# Patient Record
Sex: Male | Born: 1985 | Race: White | Hispanic: No | Marital: Single | Smoking: Never smoker
Health system: Southern US, Community
[De-identification: ages and names within clinical notes are randomized; demographics above are authoritative.]

## PROBLEM LIST (undated history)

## (undated) DIAGNOSIS — F840 Autistic disorder: Secondary | ICD-10-CM

## (undated) HISTORY — PX: NO PAST SURGERIES: SHX2092

---

## 2009-04-09 ENCOUNTER — Emergency Department (HOSPITAL_COMMUNITY): Admission: EM | Admit: 2009-04-09 | Discharge: 2009-04-09 | Payer: Self-pay | Admitting: Emergency Medicine

## 2010-04-30 LAB — URINALYSIS, ROUTINE W REFLEX MICROSCOPIC
Bilirubin Urine: NEGATIVE
Glucose, UA: NEGATIVE mg/dL
Nitrite: NEGATIVE
Specific Gravity, Urine: 1.015 (ref 1.005–1.030)
pH: 7.5 (ref 5.0–8.0)

## 2011-04-11 ENCOUNTER — Emergency Department (HOSPITAL_COMMUNITY): Payer: Medicaid Other

## 2011-04-11 ENCOUNTER — Encounter (HOSPITAL_COMMUNITY): Payer: Self-pay

## 2011-04-11 ENCOUNTER — Emergency Department (HOSPITAL_COMMUNITY)
Admission: EM | Admit: 2011-04-11 | Discharge: 2011-04-11 | Disposition: A | Discharge status: Home / Self Care | Payer: Medicaid Other | Attending: Emergency Medicine | Admitting: Emergency Medicine

## 2011-04-11 DIAGNOSIS — R109 Unspecified abdominal pain: Secondary | ICD-10-CM | POA: Insufficient documentation

## 2011-04-11 DIAGNOSIS — N200 Calculus of kidney: Secondary | ICD-10-CM

## 2011-04-11 DIAGNOSIS — N201 Calculus of ureter: Secondary | ICD-10-CM | POA: Insufficient documentation

## 2011-04-11 DIAGNOSIS — Z79899 Other long term (current) drug therapy: Secondary | ICD-10-CM | POA: Insufficient documentation

## 2011-04-11 DIAGNOSIS — F840 Autistic disorder: Secondary | ICD-10-CM | POA: Insufficient documentation

## 2011-04-11 HISTORY — DX: Autistic disorder: F84.0

## 2011-04-11 MED ORDER — HYDROCODONE-ACETAMINOPHEN 5-500 MG PO TABS: 1.0000 | ORAL_TABLET | Freq: Four times a day (QID) | ORAL | Status: AC | PRN

## 2011-04-11 MED ORDER — KETOROLAC TROMETHAMINE 30 MG/ML IJ SOLN
30.0000 mg | Freq: Once | INTRAMUSCULAR | Status: AC
  Administered 2011-04-11: 30 mg via INTRAVENOUS
  Filled 2011-04-11: qty 1

## 2011-04-11 NOTE — Discharge Instructions (Signed)

## 2011-04-11 NOTE — ED Provider Notes (Signed)
History     CSN: [id number]  Arrival date & time 04/11/11  0446   First MD Initiated Contact with Patient 04/11/11 0510      Chief Complaint  Patient presents with  . Flank Pain    (Consider location/radiation/quality/duration/timing/severity/associated sxs/prior treatment) HPI Comments: Had kidney stone 2 years ago on the other side, passed on own.  This feels the same.  Patient is a 26 y.o. male presenting with flank pain. The history is provided by the patient.  Flank Pain This is a new problem. The current episode started 1 to 2 hours ago. The problem occurs constantly. The problem has been rapidly worsening. Associated symptoms include abdominal pain. The symptoms are aggravated by nothing. The symptoms are relieved by nothing. He has tried nothing for the symptoms.    Past Medical History  Diagnosis Date  . Autism     History reviewed. No pertinent past surgical history.  No family history on file.  History  Substance Use Topics  . Smoking status: Never Smoker   . Smokeless tobacco: Not on file  . Alcohol Use: No      Review of Systems  Gastrointestinal: Positive for abdominal pain.  Genitourinary: Positive for flank pain.  All other systems reviewed and are negative.    Allergies  Review of patient's allergies indicates no known allergies.  Home Medications   Current Outpatient Rx  Name Route Sig Dispense Refill  . ALPRAZOLAM 0.5 MG PO TABS Oral Take 0.5 mg by mouth at bedtime as needed.    . ARIPIPRAZOLE 5 MG PO TABS Oral Take 5 mg by mouth daily.    Marland Kitchen DIVALPROEX SODIUM 125 MG PO CPSP Oral Take 125 mg by mouth 2 (two) times daily.      BP 139/61  Pulse 75  Temp(Src) 98.1 F (36.7 C) (Oral)  Resp 18  Ht 6' (1.829 m)  Wt 307 lb (139.254 kg)  BMI 41.64 kg/m2  SpO2 100%  Physical Exam  Nursing note and vitals reviewed. Constitutional: He is oriented to person, place, and time. He appears well-developed and well-nourished. No distress.  HENT:    Head: Normocephalic and atraumatic.  Neck: Normal range of motion. Neck supple.  Cardiovascular: Regular rhythm.   No murmur heard. Pulmonary/Chest: Effort normal and breath sounds normal. No respiratory distress.  Abdominal: Soft. Bowel sounds are normal. He exhibits no distension. There is no tenderness.  Musculoskeletal: Normal range of motion. He exhibits no edema.  Neurological: He is alert and oriented to person, place, and time.  Skin: Skin is warm and dry. He is not diaphoretic.    ED Course  Procedures (including critical care time)   Labs Reviewed  URINALYSIS, ROUTINE W REFLEX MICROSCOPIC   No results found.   No diagnosis found.    MDM  The CT shows a 2 mm stone in the distal uvj.  He is feeling better with the toradol.  Will discharge to home with lortab, plenty of fluids, follow up prn if febrile or not improving in the next few days.        Geoffery Lyons, MD 04/11/11 [phone]

## 2011-04-11 NOTE — ED Notes (Signed)
Right flank pain onset approx 1 am with nausea, no vomiting, states pain is sharp.  Previous hx of kidney stone on left.

## 2012-01-22 ENCOUNTER — Encounter (HOSPITAL_COMMUNITY): Payer: Self-pay | Admitting: Emergency Medicine

## 2012-01-22 ENCOUNTER — Emergency Department (HOSPITAL_COMMUNITY)
Admission: EM | Admit: 2012-01-22 | Discharge: 2012-01-22 | Disposition: A | Discharge status: Home / Self Care | Payer: Medicaid Other | Attending: Emergency Medicine | Admitting: Emergency Medicine

## 2012-01-22 DIAGNOSIS — IMO0001 Reserved for inherently not codable concepts without codable children: Secondary | ICD-10-CM | POA: Insufficient documentation

## 2012-01-22 DIAGNOSIS — R51 Headache: Secondary | ICD-10-CM | POA: Insufficient documentation

## 2012-01-22 DIAGNOSIS — Z79899 Other long term (current) drug therapy: Secondary | ICD-10-CM | POA: Insufficient documentation

## 2012-01-22 DIAGNOSIS — J3489 Other specified disorders of nose and nasal sinuses: Secondary | ICD-10-CM | POA: Insufficient documentation

## 2012-01-22 DIAGNOSIS — R059 Cough, unspecified: Secondary | ICD-10-CM | POA: Insufficient documentation

## 2012-01-22 DIAGNOSIS — R5381 Other malaise: Secondary | ICD-10-CM | POA: Insufficient documentation

## 2012-01-22 DIAGNOSIS — H9209 Otalgia, unspecified ear: Secondary | ICD-10-CM | POA: Insufficient documentation

## 2012-01-22 DIAGNOSIS — K1370 Unspecified lesions of oral mucosa: Secondary | ICD-10-CM | POA: Insufficient documentation

## 2012-01-22 DIAGNOSIS — Z209 Contact with and (suspected) exposure to unspecified communicable disease: Secondary | ICD-10-CM | POA: Insufficient documentation

## 2012-01-22 DIAGNOSIS — L539 Erythematous condition, unspecified: Secondary | ICD-10-CM | POA: Insufficient documentation

## 2012-01-22 DIAGNOSIS — R05 Cough: Secondary | ICD-10-CM | POA: Insufficient documentation

## 2012-01-22 DIAGNOSIS — R5383 Other fatigue: Secondary | ICD-10-CM | POA: Insufficient documentation

## 2012-01-22 DIAGNOSIS — B349 Viral infection, unspecified: Secondary | ICD-10-CM

## 2012-01-22 DIAGNOSIS — B9789 Other viral agents as the cause of diseases classified elsewhere: Secondary | ICD-10-CM | POA: Insufficient documentation

## 2012-01-22 DIAGNOSIS — R000 Tachycardia, unspecified: Secondary | ICD-10-CM | POA: Insufficient documentation

## 2012-01-22 DIAGNOSIS — F840 Autistic disorder: Secondary | ICD-10-CM | POA: Insufficient documentation

## 2012-01-22 DIAGNOSIS — J029 Acute pharyngitis, unspecified: Secondary | ICD-10-CM | POA: Insufficient documentation

## 2012-01-22 MED ORDER — GUAIFENESIN-CODEINE 100-10 MG/5ML PO SOLN
10.0000 mL | Freq: Once | ORAL | Status: AC
  Administered 2012-01-22: 10 mL via ORAL
  Filled 2012-01-22: qty 10

## 2012-01-22 MED ORDER — GUAIFENESIN-CODEINE 100-10 MG/5ML PO SYRP: 10.0000 mL | ORAL_SOLUTION | Freq: Three times a day (TID) | ORAL | Status: AC | PRN

## 2012-01-22 MED ORDER — OSELTAMIVIR PHOSPHATE 75 MG PO CAPS: 75.0000 mg | ORAL_CAPSULE | Freq: Two times a day (BID) | ORAL | Status: DC

## 2012-01-22 MED ORDER — SODIUM CHLORIDE 0.9 % IV SOLN
Freq: Once | INTRAVENOUS | Status: AC
  Administered 2012-01-22: 11:00:00 via INTRAVENOUS

## 2012-01-22 MED ORDER — IBUPROFEN 800 MG PO TABS
800.0000 mg | ORAL_TABLET | Freq: Once | ORAL | Status: AC
  Administered 2012-01-22: 800 mg via ORAL
  Filled 2012-01-22: qty 1

## 2012-01-22 MED ORDER — IBUPROFEN 800 MG PO TABS: 800.0000 mg | ORAL_TABLET | Freq: Three times a day (TID) | ORAL | Status: DC

## 2012-01-22 NOTE — ED Notes (Signed)
Pt c/o fever and cough x2 days 

## 2012-01-22 NOTE — ED Provider Notes (Signed)
History     CSN: [id number]  Arrival date & time 01/22/12  0909   First MD Initiated Contact with Patient 01/22/12 0913      Chief Complaint  Patient presents with  . Fever  . Cough    (Consider location/radiation/quality/duration/timing/severity/associated sxs/prior treatment) HPI Comments: Patient c/o fever, nasal congestion, intermittently productive cough, body aches and frontal headache.  Symptoms began 2 days ago.  States the cough is worse at night.  He denies vomiting, abdominal pain, dysuria, chest pain or shortness of breath  Patient is a 26 y.o. male presenting with URI. The history is provided by the patient and a parent.  URI The primary symptoms include fever, fatigue, headaches, ear pain, sore throat and cough. Primary symptoms do not include swollen glands, wheezing, abdominal pain, nausea, vomiting, myalgias, arthralgias or rash. The current episode started 2 days ago. This is a new problem. The problem has not changed since onset. The headache is not associated with neck stiffness or weakness.  The sore throat is not accompanied by trouble swallowing.  The onset of the illness is associated with exposure to sick contacts. Symptoms associated with the illness include chills and congestion. The illness is not associated with facial pain, sinus pressure or rhinorrhea.    Past Medical History  Diagnosis Date  . Autism     History reviewed. No pertinent past surgical history.  History reviewed. No pertinent family history.  History  Substance Use Topics  . Smoking status: Never Smoker   . Smokeless tobacco: Not on file  . Alcohol Use: No      Review of Systems  Constitutional: Positive for fever, chills and fatigue. Negative for activity change and appetite change.  HENT: Positive for ear pain, congestion and sore throat. Negative for rhinorrhea, trouble swallowing, neck pain, neck stiffness and sinus pressure.   Respiratory: Positive for cough. Negative  for chest tightness, shortness of breath and wheezing.   Cardiovascular: Negative for chest pain.  Gastrointestinal: Negative for nausea, vomiting and abdominal pain.  Genitourinary: Negative for dysuria and flank pain.  Musculoskeletal: Negative for myalgias and arthralgias.  Skin: Negative for rash.  Neurological: Positive for headaches. Negative for dizziness, weakness and numbness.  Hematological: Negative for adenopathy.  Psychiatric/Behavioral: Negative for confusion.  All other systems reviewed and are negative.    Allergies  Review of patient's allergies indicates no known allergies.  Home Medications   Current Outpatient Rx  Name  Route  Sig  Dispense  Refill  . ALPRAZOLAM 0.5 MG PO TABS   Oral   Take 0.5 mg by mouth at bedtime as needed.         . ARIPIPRAZOLE 5 MG PO TABS   Oral   Take 5 mg by mouth daily.         Marland Kitchen DIVALPROEX SODIUM 125 MG PO CPSP   Oral   Take 125 mg by mouth 2 (two) times daily.           There were no vitals taken for this visit.  Physical Exam  Nursing note and vitals reviewed. Constitutional: He is oriented to person, place, and time. He appears well-developed and well-nourished. No distress.  HENT:  Head: Normocephalic and atraumatic. No trismus in the jaw.  Right Ear: Tympanic membrane and ear canal normal.  Left Ear: Tympanic membrane and ear canal normal.  Nose: Mucosal edema and rhinorrhea present.  Mouth/Throat: Uvula is midline. Mucous membranes are dry. No uvula swelling. Posterior oropharyngeal erythema present. No oropharyngeal  exudate, posterior oropharyngeal edema or tonsillar abscesses.  Eyes: EOM are normal. Pupils are equal, round, and reactive to light.  Neck: Normal range of motion and phonation normal. Neck supple. No Brudzinski's sign and no Kernig's sign noted.  Cardiovascular: Regular rhythm, normal heart sounds and intact distal pulses.  Tachycardia present.   No murmur heard. Pulmonary/Chest: Effort  normal. No respiratory distress. He has no wheezes. He has no rales. He exhibits no tenderness.       Slightly coarse lung sounds bilaterally with active cough.  No rales or wheezing  Abdominal: Soft. He exhibits no distension and no mass. There is no tenderness. There is no rebound and no guarding.  Musculoskeletal: He exhibits no edema and no tenderness.  Lymphadenopathy:    He has no cervical adenopathy.  Neurological: He is alert and oriented to person, place, and time. He exhibits normal muscle tone. Coordination normal.  Skin: Skin is warm and dry.    ED Course  Procedures (including critical care time)  Labs Reviewed - No data to display No results found.      MDM     Patient is alert, non-toxic appearing, no meningeal signs .  Lungs are CTA bilaterally.  Pt has been taking OTC cold medication, vitals indicate tachycardia and pt appears slighlty dry so will rehydrate with IVF's.  Sx's are likely related to viral illness.  Will treat with Tamiflu, ibuprofen, and Robitussin AC.  Doubt PE.   On recheck, pt is feeling much better, vitals improved.  Patient also seen by EDP and care plan discussed.  Agrees to return here if sx's worsen.   Jayd Forrey L. [place], [place] 01/24/12 1417

## 2012-01-24 NOTE — ED Provider Notes (Signed)
Medical screening examination/treatment/procedure(s) were conducted as a shared visit with non-physician practitioner(s) and myself.  I personally evaluated the patient during the encounter.  Patient is hemodynamically stable. Stable after IV fluids   Donnetta Hutching, MD 01/24/12 1553

## 2012-07-28 ENCOUNTER — Emergency Department (HOSPITAL_COMMUNITY)
Admission: EM | Admit: 2012-07-28 | Discharge: 2012-07-28 | Disposition: A | Discharge status: Home / Self Care | Payer: Medicaid Other | Attending: Emergency Medicine | Admitting: Emergency Medicine

## 2012-07-28 ENCOUNTER — Encounter (HOSPITAL_COMMUNITY): Payer: Self-pay | Admitting: *Deleted

## 2012-07-28 DIAGNOSIS — K098 Other cysts of oral region, not elsewhere classified: Secondary | ICD-10-CM

## 2012-07-28 DIAGNOSIS — Z79899 Other long term (current) drug therapy: Secondary | ICD-10-CM | POA: Insufficient documentation

## 2012-07-28 DIAGNOSIS — K099 Cyst of oral region, unspecified: Secondary | ICD-10-CM | POA: Insufficient documentation

## 2012-07-28 DIAGNOSIS — F840 Autistic disorder: Secondary | ICD-10-CM | POA: Insufficient documentation

## 2012-07-28 NOTE — ED Provider Notes (Signed)
History    CSN: [id number] Arrival date & time 07/28/12  2020  First MD Initiated Contact with Patient 07/28/12 2025     Chief Complaint  Patient presents with  . Dental Pain   (Consider location/radiation/quality/duration/timing/severity/associated sxs/prior Treatment) Patient is a 27 y.o. male presenting with tooth pain. The history is provided by the patient.  Dental Pain Location:  Lower Lower teeth location:  30/RL 1st molar Quality:  No pain Severity:  No pain Chronicity:  New Worsened by:  Nothing tried Ineffective treatments:  None tried Associated symptoms: no difficulty swallowing, no facial pain, no facial swelling, no fever, no headaches, no neck pain, no neck swelling, no oral bleeding, no oral lesions and no trismus    Douglas Randolph is a 27 y.o. male who presents to the ED with a knot on the lower right gum area. He noticed the area today, no sure how long it has been there. The area is firm and there is no pain. He has not dental problems.  Past Medical History  Diagnosis Date  . Autism    History reviewed. No pertinent past surgical history. No family history on file. History  Substance Use Topics  . Smoking status: Never Smoker   . Smokeless tobacco: Not on file  . Alcohol Use: No    Review of Systems  Constitutional: Negative for fever and chills.  HENT: Negative for facial swelling, mouth sores and neck pain.        Know lower right jaw   Respiratory: Negative for cough.   Gastrointestinal: Negative for nausea and vomiting.  Skin: Negative for rash.  Neurological: Negative for headaches.  Psychiatric/Behavioral: The patient is not nervous/anxious.     Allergies  Review of patient's allergies indicates no known allergies.  Home Medications   Current Outpatient Rx  Name  Route  Sig  Dispense  Refill  . ALPRAZolam (XANAX) 0.5 MG tablet   Oral   Take 0.5 mg by mouth 2 (two) times daily.          . ARIPiprazole (ABILIFY) 5 MG tablet  Oral   Take 5 mg by mouth at bedtime.          . divalproex (DEPAKOTE ER) 500 MG 24 hr tablet   Oral   Take 500 mg by mouth at bedtime. Autism/ keeps his moods leveled out         . ibuprofen (ADVIL,MOTRIN) 800 MG tablet   Oral   Take 1 tablet (800 mg total) by mouth 3 (three) times daily.   21 tablet   0   . oseltamivir (TAMIFLU) 75 MG capsule   Oral   Take 1 capsule (75 mg total) by mouth 2 (two) times daily. For 5 days   10 capsule   0    There were no vitals taken for this visit. Physical Exam  Nursing note and vitals reviewed. Constitutional: He is oriented to person, place, and time. He appears well-developed and well-nourished. No distress.  HENT:  Head: Normocephalic.  Right Ear: Tympanic membrane normal.  Left Ear: Tympanic membrane normal.  Nose: Nose normal.  Mouth/Throat: Uvula is midline, oropharynx is clear and moist and mucous membranes are normal.  There is a cystic area palpated in the right lower gum area at the first molar. Non tender with palpation. The area if firm and stationary.  Eyes: EOM are normal.  Neck: Neck supple.  Pulmonary/Chest: Effort normal.  Musculoskeletal: Normal range of motion.  Neurological: He is alert  and oriented to person, place, and time. No cranial nerve deficit.  Skin: Skin is warm and dry.  Psychiatric: He has a normal mood and affect. His behavior is normal.    ED Course  Procedures  MDM  27 y.o. male with oral cystic lesion. No acute distress, no pain. Will have patient follow up with the dental clinic for further evaluation.  Discussed with the patient and his mother clinical findings and need for follow up. All questioned fully answered. He will return if any problems arise.   [employer], [place] 07/28/12 2059

## 2012-07-28 NOTE — ED Notes (Signed)
Knot in inner right jaw, denies any pain

## 2012-07-29 NOTE — ED Provider Notes (Signed)
Medical screening examination/treatment/procedure(s) were performed by non-physician practitioner and as supervising physician I was immediately available for consultation/collaboration. Jas Betten, MD, FACEP   Vineta Carone L Korey Arroyo, MD 07/29/12 0023 

## 2012-10-26 ENCOUNTER — Encounter (HOSPITAL_COMMUNITY): Payer: Self-pay | Admitting: Pharmacy Technician

## 2012-10-27 NOTE — Pre-Procedure Instructions (Signed)
Douglas Randolph  10/27/2012   Your procedure is scheduled on:  September 30  Report to [facility] Entrance "A" [address] at 06:00 AM.  Call this number if you have problems the morning of surgery: [phone]   Remember:   Do not eat food or drink liquids after midnight.   Take these medicines the morning of surgery with A SIP OF WATER: None   Do not take Aspirin, Aleve, Naproxen, Advil, Ibuprofen, Vitamin, Herbs, or Supplements starting today   Do not wear jewelry, make-up or nail polish.  Do not wear lotions, powders, or perfumes. You may wear deodorant.  Do not shave 48 hours prior to surgery. Men may shave face and neck.  Do not bring valuables to the hospital.  [facility] is not responsible                   for any belongings or valuables.  Contacts, dentures or bridgework may not be worn into surgery.  Leave suitcase in the car. After surgery it may be brought to your room.  For patients admitted to the hospital, checkout time is 11:00 AM the day of  discharge.   Patients discharged the day of surgery will not be allowed to drive  home.  Name and phone number of your driver: Family/ Friend  Special Instructions: Shower using CHG 2 nights before surgery and the night before surgery.  If you shower the day of surgery use CHG.  Use special wash - you have one bottle of CHG for all showers.  You should use approximately 1/3 of the bottle for each shower.   Please read over the following fact sheets that you were given: Pain Booklet, Coughing and Deep Breathing and Surgical Site Infection Prevention

## 2012-10-28 ENCOUNTER — Encounter (HOSPITAL_COMMUNITY): Payer: Self-pay

## 2012-10-28 ENCOUNTER — Encounter (HOSPITAL_COMMUNITY)
Admission: RE | Admit: 2012-10-28 | Discharge: 2012-10-28 | Disposition: A | Discharge status: Home / Self Care | Payer: Medicaid Other | Source: Ambulatory Visit | Attending: Oral Surgery | Admitting: Oral Surgery

## 2012-10-28 DIAGNOSIS — Z01818 Encounter for other preprocedural examination: Secondary | ICD-10-CM | POA: Insufficient documentation

## 2012-10-28 DIAGNOSIS — Z01812 Encounter for preprocedural laboratory examination: Secondary | ICD-10-CM | POA: Insufficient documentation

## 2012-10-28 LAB — CBC
MCH: 28.8 pg (ref 26.0–34.0)
MCV: 85.9 fL (ref 78.0–100.0)
Platelets: 270 10*3/uL (ref 150–400)
RBC: 5.59 MIL/uL (ref 4.22–5.81)
RDW: 12.5 % (ref 11.5–15.5)
WBC: 8.3 10*3/uL (ref 4.0–10.5)

## 2012-10-28 LAB — BASIC METABOLIC PANEL
CO2: 26 mEq/L (ref 19–32)
Calcium: 9.4 mg/dL (ref 8.4–10.5)
Chloride: 102 mEq/L (ref 96–112)
GFR calc Af Amer: >90 mL/min (ref >90)

## 2012-11-02 MED ORDER — DEXTROSE 5 % IV SOLN
3.0000 g | INTRAVENOUS | Status: AC
  Administered 2012-11-03: 3 g via INTRAVENOUS
  Filled 2012-11-02: qty 3000

## 2012-11-02 NOTE — H&P (Signed)
HISTORY AND PHYSICAL  Douglas Randolph is a 27 y.o. male patient with CC: Painful teeth.    HPI: Patient referred by general dentist for extraction of carious and non-restorable teeth and evaluation of bony lesion right mandible. Surgery scheduled in office for 09/02/2012 but cancelled due to unable to obtain IV access.  No diagnosis found.  Past Medical History  Diagnosis Date  . Autism     No current facility-administered medications for this encounter.   Current Outpatient Prescriptions  Medication Sig Dispense Refill  . ALPRAZolam (XANAX) 0.5 MG tablet Take 0.5 mg by mouth at bedtime.       . ARIPiprazole (ABILIFY) 5 MG tablet Take 5 mg by mouth at bedtime.       . divalproex (DEPAKOTE ER) 500 MG 24 hr tablet Take 500 mg by mouth at bedtime.       No Known Allergies Active Problems:   * No active hospital problems. *  Vitals: There were no vitals taken for this visit. Lab results:No results found for this or any previous visit (from the past 24 hour(s)). Radiology Results: No results found. General appearance: alert, cooperative and morbidly obese Head: Normocephalic, without obvious abnormality, atraumatic Eyes: negative Ears: normal TM's and external ear canals both ears Nose: Nares normal. Septum midline. Mucosa normal. No drainage or sinus tenderness. Throat: Dental caries teeth #'s 1, 2, 5, 15, 26, 27, 21, 32. 2 cm by 2 cm bony protuberance right mandible adjacent to tooth # 30 area. Neck: no adenopathy, supple, symmetrical, trachea midline and thyroid not enlarged, symmetric, no tenderness/mass/nodules Resp: clear to auscultation bilaterally Cardio: regular rate and rhythm, S1, S2 normal, no murmur, click, rub or gallop  Assessment: 26 WM Autistic with non-restorable  teeth #'s 1, 2, 5, 15, 26, 27, 21, 32. Right mandible  Bony tumor.  Plan:Extract teeth #'s 1, 2, 5, 15, 26, 27, 21, 32. Removal bone tumor right mandible . General anesthesia. Day surgery. [place] Lopes 11/02/2012

## 2012-11-03 ENCOUNTER — Encounter (HOSPITAL_COMMUNITY): Payer: Self-pay | Admitting: Certified Registered"

## 2012-11-03 ENCOUNTER — Encounter (HOSPITAL_COMMUNITY)
Admission: RE | Disposition: A | Discharge status: Home / Self Care | Payer: Self-pay | Source: Ambulatory Visit | Attending: Oral Surgery

## 2012-11-03 ENCOUNTER — Encounter (HOSPITAL_COMMUNITY): Payer: Self-pay | Admitting: *Deleted

## 2012-11-03 ENCOUNTER — Ambulatory Visit (HOSPITAL_COMMUNITY)
Admission: RE | Admit: 2012-11-03 | Discharge: 2012-11-03 | Disposition: A | Discharge status: Home / Self Care | Payer: Medicaid Other | Source: Ambulatory Visit | Attending: Oral Surgery | Admitting: Oral Surgery

## 2012-11-03 ENCOUNTER — Ambulatory Visit (HOSPITAL_COMMUNITY): Payer: Medicaid Other | Admitting: Certified Registered"

## 2012-11-03 DIAGNOSIS — K029 Dental caries, unspecified: Secondary | ICD-10-CM

## 2012-11-03 DIAGNOSIS — D165 Benign neoplasm of lower jaw bone: Secondary | ICD-10-CM | POA: Insufficient documentation

## 2012-11-03 DIAGNOSIS — F840 Autistic disorder: Secondary | ICD-10-CM | POA: Insufficient documentation

## 2012-11-03 HISTORY — PX: TOOTH EXTRACTION: SHX859

## 2012-11-03 HISTORY — PX: DEBRIDEMENT MANDIBLE: SHX5308

## 2012-11-03 SURGERY — DENTAL RESTORATION/EXTRACTIONS
Anesthesia: General | Site: Mouth | Laterality: Right | Wound class: Clean Contaminated

## 2012-11-03 MED ORDER — PROPOFOL 10 MG/ML IV BOLUS
INTRAVENOUS | Status: DC | PRN
  Administered 2012-11-03: 300 mg via INTRAVENOUS
  Administered 2012-11-03: 50 mg via INTRAVENOUS

## 2012-11-03 MED ORDER — FENTANYL CITRATE 0.05 MG/ML IJ SOLN
INTRAMUSCULAR | Status: DC | PRN
  Administered 2012-11-03: 150 ug via INTRAVENOUS
  Administered 2012-11-03: 50 ug via INTRAVENOUS

## 2012-11-03 MED ORDER — LIDOCAINE-EPINEPHRINE 2 %-1:100000 IJ SOLN
INTRAMUSCULAR | Status: AC
  Filled 2012-11-03: qty 1

## 2012-11-03 MED ORDER — HYDROMORPHONE HCL PF 1 MG/ML IJ SOLN: 0.2500 mg | INTRAMUSCULAR | Status: DC | PRN

## 2012-11-03 MED ORDER — MIDAZOLAM HCL 5 MG/5ML IJ SOLN
INTRAMUSCULAR | Status: DC | PRN
  Administered 2012-11-03: 2 mg via INTRAVENOUS

## 2012-11-03 MED ORDER — MEPERIDINE HCL 25 MG/ML IJ SOLN: 6.2500 mg | INTRAMUSCULAR | Status: DC | PRN

## 2012-11-03 MED ORDER — OXYCODONE HCL 5 MG PO TABS: 5.0000 mg | ORAL_TABLET | Freq: Once | ORAL | Status: DC | PRN

## 2012-11-03 MED ORDER — OXYMETAZOLINE HCL 0.05 % NA SOLN
NASAL | Status: DC | PRN
  Administered 2012-11-03: 1 via NASAL

## 2012-11-03 MED ORDER — 0.9 % SODIUM CHLORIDE (POUR BTL) OPTIME
TOPICAL | Status: DC | PRN
  Administered 2012-11-03: 1000 mL

## 2012-11-03 MED ORDER — SUCCINYLCHOLINE CHLORIDE 20 MG/ML IJ SOLN
INTRAMUSCULAR | Status: DC | PRN
  Administered 2012-11-03 (×2): 100 mg via INTRAVENOUS

## 2012-11-03 MED ORDER — ONDANSETRON HCL 4 MG/2ML IJ SOLN: 4.0000 mg | Freq: Once | INTRAMUSCULAR | Status: DC | PRN

## 2012-11-03 MED ORDER — OXYCODONE-ACETAMINOPHEN 5-325 MG PO TABS: 1.0000 | ORAL_TABLET | ORAL | Status: AC | PRN

## 2012-11-03 MED ORDER — ONDANSETRON HCL 4 MG/2ML IJ SOLN
INTRAMUSCULAR | Status: DC | PRN
  Administered 2012-11-03: 4 mg via INTRAVENOUS

## 2012-11-03 MED ORDER — SODIUM CHLORIDE 0.9 % IR SOLN
Status: DC | PRN
  Administered 2012-11-03: 1000 mL

## 2012-11-03 MED ORDER — OXYMETAZOLINE HCL 0.05 % NA SOLN
NASAL | Status: AC
  Filled 2012-11-03: qty 15

## 2012-11-03 MED ORDER — AMOXICILLIN 500 MG PO CAPS: 500.0000 mg | ORAL_CAPSULE | Freq: Four times a day (QID) | ORAL | Status: DC

## 2012-11-03 MED ORDER — LIDOCAINE HCL (CARDIAC) 20 MG/ML IV SOLN
INTRAVENOUS | Status: DC | PRN
  Administered 2012-11-03: 100 mg via INTRAVENOUS

## 2012-11-03 MED ORDER — LIDOCAINE-EPINEPHRINE 2 %-1:100000 IJ SOLN
INTRAMUSCULAR | Status: DC | PRN
  Administered 2012-11-03: 20 mL

## 2012-11-03 MED ORDER — LACTATED RINGERS IV SOLN
INTRAVENOUS | Status: DC
  Administered 2012-11-03: 08:00:00 via INTRAVENOUS

## 2012-11-03 MED ORDER — OXYCODONE HCL 5 MG/5ML PO SOLN: 5.0000 mg | Freq: Once | ORAL | Status: DC | PRN

## 2012-11-03 SURGICAL SUPPLY — 51 items
BENZOIN TINCTURE PRP APPL 2/3 (GAUZE/BANDAGES/DRESSINGS) IMPLANT
BLADE DERMATOME II (BLADE) ×3 IMPLANT
BLADE SURG 15 STRL LF DISP TIS (BLADE) ×4 IMPLANT
BLADE SURG 15 STRL SS (BLADE) ×2
BUR CROSS CUT FISSURE 1.6 (BURR) ×3 IMPLANT
BUR EGG ELITE 4.0 (BURR) ×3 IMPLANT
CANISTER SUCTION 2500CC (MISCELLANEOUS) ×3 IMPLANT
CLOTH BEACON ORANGE TIMEOUT ST (SAFETY) ×3 IMPLANT
COVER SURGICAL LIGHT HANDLE (MISCELLANEOUS) ×3 IMPLANT
DECANTER SPIKE VIAL GLASS SM (MISCELLANEOUS) ×3 IMPLANT
DERMACARRIERS GRAFT 1 TO 1.5 (DISPOSABLE)
DRESSING TELFA 8X3 (GAUZE/BANDAGES/DRESSINGS) IMPLANT
DRSG OPSITE 4X5.5 SM (GAUZE/BANDAGES/DRESSINGS) IMPLANT
DRSG OPSITE 6X11 MED (GAUZE/BANDAGES/DRESSINGS) IMPLANT
ELECT COATED BLADE 2.86 ST (ELECTRODE) IMPLANT
ELECT NEEDLE BLADE 2-5/6 (NEEDLE) IMPLANT
ELECT REM PT RETURN 9FT ADLT (ELECTROSURGICAL)
ELECTRODE REM PT RTRN 9FT ADLT (ELECTROSURGICAL) IMPLANT
GAUZE PACKING FOLDED 2  STR (GAUZE/BANDAGES/DRESSINGS) ×1
GAUZE PACKING FOLDED 2 STR (GAUZE/BANDAGES/DRESSINGS) ×2 IMPLANT
GAUZE SPONGE 4X4 16PLY XRAY LF (GAUZE/BANDAGES/DRESSINGS) ×3 IMPLANT
GLOVE BIO SURGEON STRL SZ 6.5 (GLOVE) ×3 IMPLANT
GLOVE BIO SURGEON STRL SZ7.5 (GLOVE) ×6 IMPLANT
GLOVE BIOGEL PI IND STRL 7.0 (GLOVE) ×2 IMPLANT
GLOVE BIOGEL PI INDICATOR 7.0 (GLOVE) ×1
GOWN STRL NON-REIN LRG LVL3 (GOWN DISPOSABLE) ×9 IMPLANT
GOWN STRL REIN XL XLG (GOWN DISPOSABLE) ×3 IMPLANT
GRAFT DERMACARRIERS 1 TO 1.5 (DISPOSABLE) IMPLANT
KIT BASIN OR (CUSTOM PROCEDURE TRAY) ×3 IMPLANT
KIT ROOM TURNOVER OR (KITS) ×3 IMPLANT
NEEDLE 22X1 1/2 (OR ONLY) (NEEDLE) ×9 IMPLANT
NEEDLE BLUNT 16X1.5 OR ONLY (NEEDLE) IMPLANT
NS IRRIG 1000ML POUR BTL (IV SOLUTION) ×3 IMPLANT
PAD ARMBOARD 7.5X6 YLW CONV (MISCELLANEOUS) ×6 IMPLANT
PENCIL BUTTON HOLSTER BLD 10FT (ELECTRODE) IMPLANT
PUTTY DBX 2.5CC (Putty) ×3 IMPLANT
PUTTY DBX 2.5CC DEPUY (Putty) ×2 IMPLANT
SPONGE LAP 18X18 X RAY DECT (DISPOSABLE) IMPLANT
SPONGE SURGIFOAM ABS GEL 12-7 (HEMOSTASIS) IMPLANT
SUT CHROMIC 3 0 PS 2 (SUTURE) ×3 IMPLANT
SUT ETHILON 5 0 P 3 18 (SUTURE)
SUT NYLON ETHILON 5-0 P-3 1X18 (SUTURE) IMPLANT
SUT STEEL 2 (SUTURE) IMPLANT
SYR 50ML SLIP (SYRINGE) IMPLANT
SYR CONTROL 10ML LL (SYRINGE) ×6 IMPLANT
TOWEL OR 17X24 6PK STRL BLUE (TOWEL DISPOSABLE) ×3 IMPLANT
TRAY ENT MC OR (CUSTOM PROCEDURE TRAY) ×3 IMPLANT
TUBE CONNECTING 12X1/4 (SUCTIONS) ×3 IMPLANT
TUBING IRRIGATION (MISCELLANEOUS) ×3 IMPLANT
WATER STERILE IRR 1000ML POUR (IV SOLUTION) ×3 IMPLANT
YANKAUER SUCT BULB TIP NO VENT (SUCTIONS) ×3 IMPLANT

## 2012-11-03 NOTE — Anesthesia Postprocedure Evaluation (Signed)
Anesthesia Post Note  Patient: Douglas Randolph  Procedure(s) Performed: Procedure(s) (LRB): EXTRACTIONS 1, 2, 5, 15, 16, 17, 21, 32 (N/A) EXCISION TUMOR RIGHT MANDIBLE, BONE GRAFT RIGHT MANDIBLE (Right)  Anesthesia type: general  Patient location: PACU  Post pain: Pain level controlled  Post assessment: Patient's Cardiovascular Status Stable  Last Vitals:  Filed Vitals:   11/03/12 0705  BP: 134/75  Pulse: 92  Temp: 36.1 C  Resp: 20    Post vital signs: Reviewed and stable  Level of consciousness: sedated  Complications: No apparent anesthesia complications

## 2012-11-03 NOTE — H&P (Signed)
H&P documentation  -History and Physical Reviewed  -Patient has been re-examined  -No change in the plan of care  Douglas Randolph  

## 2012-11-03 NOTE — Procedures (Signed)
Procedure Note: After completion of surgery on 11/03/2012, it was discovered that tooth # 21 had not been extracted as planned. The extraction of tooth # 21 was indicated due to deep caries. Prior consent had been obtained pre-operatively. The tooth was removed in the recovery room with no additional anesthesia or sedation. The left mandible had previously been anesthetized with lidocaine in the OR and was numb at the time of recovery room procedure. The tissue was reflected around tooth # 21 and bone was removed with the rongeur. The tooth was elevated with a 301 elevator. Additional bone was removed using 301 elevator and mallet as the tooth was difficult to mobilize. The tooth was eventually removed using a rongeur. The socket was curetted and closed with 3-0 chromic gut. The patient tolerated the procedure well and was discharged to home shortly afterwards.

## 2012-11-03 NOTE — Transfer of Care (Signed)
Immediate Anesthesia Transfer of Care Note  Patient: Douglas Randolph  Procedure(s) Performed: Procedure(s): EXTRACTIONS 1, 2, 5, 15, 16, 17, 21, 32 (N/A) EXCISION TUMOR RIGHT MANDIBLE, BONE GRAFT RIGHT MANDIBLE (Right)  Patient Location: PACU  Anesthesia Type:General  Level of Consciousness: awake, alert  and oriented  Airway & Oxygen Therapy: Patient Spontanous Breathing and Patient connected to nasal cannula oxygen  Post-op Assessment: Report given to PACU RN and Post -op Vital signs reviewed and stable  Post vital signs: Reviewed and stable  Complications: No apparent anesthesia complications

## 2012-11-03 NOTE — Anesthesia Preprocedure Evaluation (Addendum)
Anesthesia Evaluation  Patient identified by MRN, date of birth, ID band Patient awake    Reviewed: Allergy & Precautions, H&P , NPO status , Patient's Chart, lab work & pertinent test results  Airway Mallampati: II TM Distance: >3 FB Neck ROM: Full    Dental   Pulmonary          Cardiovascular     Neuro/Psych autistic   GI/Hepatic   Endo/Other    Renal/GU      Musculoskeletal   Abdominal   Peds  Hematology   Anesthesia Other Findings   Reproductive/Obstetrics                           Anesthesia Physical Anesthesia Plan  ASA: II  Anesthesia Plan: General   Post-op Pain Management:    Induction: Intravenous  Airway Management Planned: Nasal ETT  Additional Equipment:   Intra-op Plan:   Post-operative Plan: Extubation in OR  Informed Consent: I have reviewed the patients History and Physical, chart, labs and discussed the procedure including the risks, benefits and alternatives for the proposed anesthesia with the patient or authorized representative who has indicated his/her understanding and acceptance.   Dental advisory given  Plan Discussed with: Surgeon and CRNA  Anesthesia Plan Comments:        Anesthesia Quick Evaluation

## 2012-11-03 NOTE — Progress Notes (Signed)
Dr Barbette Merino here for additional procedure. Mother updated,

## 2012-11-03 NOTE — Op Note (Signed)
11/03/2012  10:04 AM  PATIENT:  Douglas Randolph  27 y.o. male  PRE-OPERATIVE DIAGNOSIS:  NON RESTORABLE TEETH #'s 1, 2, 5, 15, 16, 17, 32, LESION RIGHT MANDIBLE  POST-OPERATIVE DIAGNOSIS:  SAME  PROCEDURE:  Procedure(s): EXTRACTIONS 1, 2, 5, 15, 16, 17,  32 EXCISION TUMOR RIGHT MANDIBLE, BONE GRAFT RIGHT MANDIBLE  SURGEON:  Surgeon(s): [place] Lopes, DDS  ANESTHESIA:   local and general  EBL:  minimal  DRAINS: none   SPECIMEN:  LESION RIGHT MANDIBLE  COUNTS:  YES  PLAN OF CARE: Discharge to home after PACU  PATIENT DISPOSITION:  PACU - hemodynamically stable.   PROCEDURE DETAILS: Dictation #[id number]  [place] Lopes, DMD 11/03/2012 10:04 AM

## 2012-11-03 NOTE — Anesthesia Procedure Notes (Signed)
Procedure Name: Intubation Date/Time: 11/03/2012 9:18 AM Performed by: Arlice Colt B Pre-anesthesia Checklist: Patient identified, Emergency Drugs available, Suction available, Patient being monitored and Timeout performed Patient Re-evaluated:Patient Re-evaluated prior to inductionOxygen Delivery Method: Circle system utilized Preoxygenation: Pre-oxygenation with 100% oxygen Intubation Type: IV induction and Rapid sequence Nasal Tubes: Nasal Rae Tube size: 7.5 mm Number of attempts: 2 Placement Confirmation: ETT inserted through vocal cords under direct vision,  positive ETCO2 and breath sounds checked- equal and bilateral Secured at: 25 cm Tube secured with: Tape Dental Injury: Teeth and Oropharynx as per pre-operative assessment

## 2012-11-04 NOTE — Op Note (Signed)
Douglas Randolph, VERRILLI               ACCOUNT NO.:  [id number]  MEDICAL RECORD NO.:  [id number]  LOCATION:  [facility]                         FACILITY:  [facility]  PHYSICIAN:  Douglas Randolph, M.D.  DATE OF BIRTH:  Jun 30, 1985  DATE OF PROCEDURE:  11/03/2012 DATE OF DISCHARGE:  11/03/2012                              OPERATIVE REPORT   PREOPERATIVE DIAGNOSIS:  Nonrestorable teeth numbers 1, 2, 5, 15, 16, 17, 32.  Lesion right mandible.  POSTOPERATIVE DIAGNOSIS:  Nonrestorable teeth numbers 1, 2, 5, 15, 16, 17, 32.  Lesion right mandible.  PROCEDURE:  Extraction of teeth numbers 1, 2, 5, 15, 16, 17, 32. Excision tumor, right mandible.  Bone graft, right mandible.  INDICATIONS FOR PROCEDURE:  Tiny is a 27 year old, morbidly obese male with autism who was referred by his general dentist for evaluation of possible bone tumor of the right mandible and removal of multiple nonrestorable teeth.  Because of the patient's medical history and need for adequate anesthesia, it was recommended that surgery be performed under general with airway protection via intubation.  PROCEDURE:  The patient was taken to the operating room, placed on the table in supine position.  General anesthesia was administered intravenously and a nasal endotracheal tube was placed and secured.  The eyes were protected.  The patient was draped for the procedure.  Time- out was performed.  The posterior pharynx was suctioned.  A throat pack was placed.  A 2% lidocaine with 1:100,000 epinephrine was infiltrated in an inferior alveolar block on the right and left side and buccal and palatal infiltration of the maxilla around the teeth to be extracted. Total of 14 mL cc was utilized.  A bite block was placed in the right side of the mouth.  The left side was operated first.  A #15 blade was used to make an incision around teeth numbers 15, 16 and 17.  Bone was removed with a Stryker handpiece under irrigation around tooth #17  and then these 3 teeth were elevated with a 301 elevator and removed from the mouth with the dental forceps.  The sockets were then curetted, irrigated and closed with 3-0 chromic.  The bite block and sweetheart retractor were repositioned to the other side of the mouth and a 15 blade was used to make an incision around teeth numbers 1, 2 and 5 in the maxilla and around tooth #32 in the mandible.  The mandibular incision was carried along the buccal surfaces of the dental sulcus anteriorly to tooth #29.  The periosteum was then reflected in the mandible exposing the bony tumor along the right mandible.  The tumor measured approximately 2 cm x 1 cm x 1.5 cm and was expansile at the alveolar crest along teeth numbers 30 and 31.  The soft tissue was then reflected around the upper and lower teeth to be removed.  The 301 elevator was used to elevate the teeth and tooth numbers 1, 2 were removed with the universal forceps.  Tooth #5 required additional removal of bone and sectioning prior to removal with a rongeur.  Tooth #32 required removal of bone and then this tooth was removed with the dental forceps.  The  sockets were then irrigated and closed with 3-0 chromic.  Then, the Stryker handpiece was used to make cuts along the superior aspect of the bony tumor just through the outer layer of bone. Then, the osteotome and mallet were used to remove the protrusion from the lateral aspect of the mandible.  The lesion also contained soft tissue within it as well as granular type of bone and this was curetted out, was found to go between apices of teeth numbers 30 and 31, and the roots were exposed after the tumor was removed.  The bony crypt was then decorticated with an egg-shaped bur and the bone between the teeth was curetted to remove any residual tumor fragments, then bone plate was placed in this area, and the extraction socket of tooth #32, and then closed with 3-0 chromic.  The oral cavity  was then irrigated and suctioned.  Throat pack was removed.  The patient was awakened and taken to the recovery room, breathing spontaneously in good condition.  EBL minimum.  Complications, none.  Specimens, tumor right mandible.  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     Douglas Randolph, M.D.     SMJ/MEDQ  D:  11/03/2012  T:  11/04/2012  Job:  [id number]

## 2012-11-06 ENCOUNTER — Encounter (HOSPITAL_COMMUNITY): Payer: Self-pay | Admitting: Oral Surgery

## 2013-09-13 ENCOUNTER — Encounter (HOSPITAL_COMMUNITY): Payer: Self-pay | Admitting: Emergency Medicine

## 2013-09-13 DIAGNOSIS — Z79899 Other long term (current) drug therapy: Secondary | ICD-10-CM | POA: Insufficient documentation

## 2013-09-13 DIAGNOSIS — R112 Nausea with vomiting, unspecified: Secondary | ICD-10-CM | POA: Diagnosis present

## 2013-09-13 DIAGNOSIS — F840 Autistic disorder: Secondary | ICD-10-CM | POA: Diagnosis not present

## 2013-09-13 DIAGNOSIS — Z792 Long term (current) use of antibiotics: Secondary | ICD-10-CM | POA: Insufficient documentation

## 2013-09-13 DIAGNOSIS — K226 Gastro-esophageal laceration-hemorrhage syndrome: Secondary | ICD-10-CM | POA: Insufficient documentation

## 2013-09-13 LAB — CBC WITH DIFFERENTIAL/PLATELET
Basophils Absolute: 0 10*3/uL (ref 0.0–0.1)
Basophils Relative: 0 % (ref 0–1)
EOS ABS: 0.1 10*3/uL (ref 0.0–0.7)
EOS PCT: 1 % (ref 0–5)
HEMATOCRIT: 47.2 % (ref 39.0–52.0)
Hemoglobin: 16.1 g/dL (ref 13.0–17.0)
LYMPHS ABS: 2.2 10*3/uL (ref 0.7–4.0)
Lymphocytes Relative: 23 % (ref 12–46)
MCH: 29.9 pg (ref 26.0–34.0)
MCHC: 34.1 g/dL (ref 30.0–36.0)
MCV: 87.6 fL (ref 78.0–100.0)
MONO ABS: 0.7 10*3/uL (ref 0.1–1.0)
Monocytes Relative: 7 % (ref 3–12)
Neutro Abs: 6.7 10*3/uL (ref 1.7–7.7)
Neutrophils Relative %: 69 % (ref 43–77)
PLATELETS: 319 10*3/uL (ref 150–400)
RBC: 5.39 MIL/uL (ref 4.22–5.81)
RDW: 12.2 % (ref 11.5–15.5)
WBC: 9.7 10*3/uL (ref 4.0–10.5)

## 2013-09-13 MED ORDER — ONDANSETRON 4 MG PO TBDP
4.0000 mg | ORAL_TABLET | Freq: Once | ORAL | Status: AC
  Administered 2013-09-14: 4 mg via ORAL
  Filled 2013-09-13: qty 1

## 2013-09-13 NOTE — ED Notes (Signed)
Patient reports started vomiting tonight shortly after eating pizza. Reports he believed to have been vomiting blood as well.

## 2013-09-14 ENCOUNTER — Emergency Department (HOSPITAL_COMMUNITY)
Admission: EM | Admit: 2013-09-14 | Discharge: 2013-09-14 | Disposition: A | Discharge status: Home / Self Care | Payer: Medicaid Other | Attending: Emergency Medicine | Admitting: Emergency Medicine

## 2013-09-14 DIAGNOSIS — K226 Gastro-esophageal laceration-hemorrhage syndrome: Secondary | ICD-10-CM

## 2013-09-14 DIAGNOSIS — R112 Nausea with vomiting, unspecified: Secondary | ICD-10-CM

## 2013-09-14 LAB — POC OCCULT BLOOD, ED: FECAL OCCULT BLD: NEGATIVE

## 2013-09-14 NOTE — ED Provider Notes (Signed)
CSN: [id number]     Arrival date & time 09/13/13  2116 History  This chart was scribed for Wynetta Fines, MD by Starleen Arms, ED Scribe. This patient was seen in room APA05/APA05 and the patient's care was started at 12:51 AM.   Chief Complaint  Patient presents with  . Vomiting     The history is provided by the patient and a relative. No language interpreter was used.    HPI Comments: Douglas Randolph is a 28 y.o. male who presents to the Emergency Department complaining of 5 to 6 episodes of vomiting that began this evening. Per patient's mother, the patient ate a pizza this evening and subsequently developed nausea and vomiting. He reports that the emesis consisted primarily of food but several times there were streaks of blood in it, and this caused him to become anxious. Patient states that he has not had a recent bowel movements but the last one he recalls was brown in color. Patient denies abdominal pain. Patient's symptoms have resolved.  Past Medical History  Diagnosis Date  . Autism    Past Surgical History  Procedure Laterality Date  . No past surgeries    . Tooth extraction N/A 11/03/2012    Procedure: EXTRACTIONS 1, 2, 5, 15, 16, 17, 21, 32;  Surgeon: Gae Bon, DDS;  Location: [place];  Service: Oral Surgery;  Laterality: N/A;  . Debridement mandible Right 11/03/2012    Procedure: EXCISION TUMOR RIGHT MANDIBLE, BONE GRAFT RIGHT MANDIBLE;  Surgeon: Gae Bon, DDS;  Location: [place];  Service: Oral Surgery;  Laterality: Right;   History reviewed. No pertinent family history. History  Substance Use Topics  . Smoking status: Never Smoker   . Smokeless tobacco: Not on file  . Alcohol Use: No    Review of Systems  A complete 10 system review of systems was obtained and all systems are negative except as noted in the HPI and PMH.   Allergies  Review of patient's allergies indicates no known allergies.  Home Medications   Prior to Admission medications    Medication Sig Start Date End Date Taking? Authorizing Provider  ALPRAZolam Duanne Moron) 0.5 MG tablet Take 0.5 mg by mouth at bedtime.     Historical Provider, MD  amoxicillin (AMOXIL) 500 MG capsule Take 1 capsule (500 mg total) by mouth 4 (four) times daily. 11/03/12   Gae Bon, DDS  ARIPiprazole (ABILIFY) 5 MG tablet Take 5 mg by mouth at bedtime.     Historical Provider, MD  divalproex (DEPAKOTE ER) 500 MG 24 hr tablet Take 500 mg by mouth at bedtime.    Historical Provider, MD  oxyCODONE-acetaminophen (PERCOCET) 5-325 MG per tablet Take 1-2 tablets by mouth every 4 (four) hours as needed for pain. 11/03/12   Scott Parthenia Ames, DDS   BP 152/95  Pulse 107  Temp(Src) 98.3 F (36.8 C) (Oral)  Resp 24  Ht 6' (1.829 m)  Wt 350 lb (158.759 kg)  BMI 47.46 kg/m2  SpO2 100%  Physical Exam  General: Well-developed, well-nourished male in no acute distress; appearance consistent with age of record HENT: normocephalic; atraumatic Eyes: pupils equal, round and reactive to light; extraocular muscles intact Neck: supple Heart: regular rate and rhythm; no murmurs, rubs or gallops Lungs: clear to auscultation bilaterally Abdomen: soft; nondistended; nontender; no masses or hepatosplenomegaly; bowel sounds present Rectal: normal sphincter tone; no hemorrhoids seen or palpated; stool on examining glove brown and heme negative.  Extremities: No deformity; full  range of motion; pulses normal Neurologic: Awake, alert and oriented; motor function intact in all extremities and symmetric; no facial droop Skin: Warm and dry Psychiatric: Normal mood and affect  ED Course  Procedures (including critical care time)  DIAGNOSTIC STUDIES: Oxygen Saturation is 100% on RA, normal by my interpretation.    COORDINATION OF CARE:  1:01 AM Discussed treatment plan with patient.  Patient acknowledges and agrees with plan.    MDM   Final diagnoses:  Nausea and vomiting in adult  Mallory-Weiss tear    Results for orders placed during the hospital encounter of 09/14/13  CBC WITH DIFFERENTIAL      Result Value Ref Range   WBC 9.7  4.0 - 10.5 K/uL   RBC 5.39  4.22 - 5.81 MIL/uL   Hemoglobin 16.1  13.0 - 17.0 g/dL   HCT 47.2  39.0 - 52.0 %   MCV 87.6  78.0 - 100.0 fL   MCH 29.9  26.0 - 34.0 pg   MCHC 34.1  30.0 - 36.0 g/dL   RDW 12.2  11.5 - 15.5 %   Platelets 319  150 - 400 K/uL   Neutrophils Relative % 69  43 - 77 %   Neutro Abs 6.7  1.7 - 7.7 K/uL   Lymphocytes Relative 23  12 - 46 %   Lymphs Abs 2.2  0.7 - 4.0 K/uL   Monocytes Relative 7  3 - 12 %   Monocytes Absolute 0.7  0.1 - 1.0 K/uL   Eosinophils Relative 1  0 - 5 %   Eosinophils Absolute 0.1  0.0 - 0.7 K/uL   Basophils Relative 0  0 - 1 %   Basophils Absolute 0.0  0.0 - 0.1 K/uL   His blood-streaked emesis and heme negative stool are consistent with a mild acute Mallory-Weiss tear.   I personally performed the services described in this documentation, which was scribed in my presence. The recorded information has been reviewed and is accurate.   Wynetta Fines, MD 09/14/13 [phone]

## 2013-09-18 IMAGING — CT CT ABD-PELV W/O CM
2 of 4 series · 17 of 46 positions shown, 19 images · non-contrast
Comparison: 04/09/2009

CLINICAL DATA: Right flank pain, history kidney stones

CT ABDOMEN AND PELVIS WITHOUT CONTRAST
TECHNIQUE: Multidetector CT imaging of the abdomen and pelvis was
performed following the standard protocol without intravenous
contrast. Sagittal and coronal MPR images reconstructed from axial
data set.

[Series 2: standard/full over (age)lbs 5.0 · axial · 0.96mm/px · z∈[-488,-18]mm · 14 of 102 slices shown, 16 images]
[im 4/102  soft-tissue]
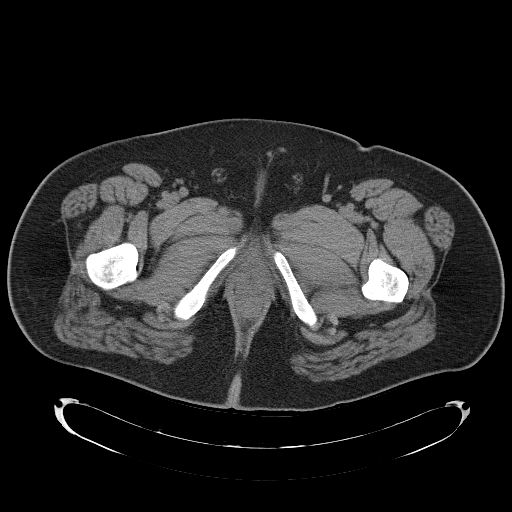
[im 4/102  bone]
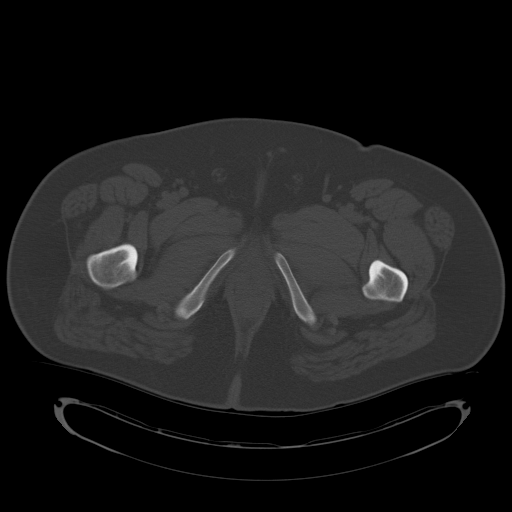
[im 12/102  soft-tissue]
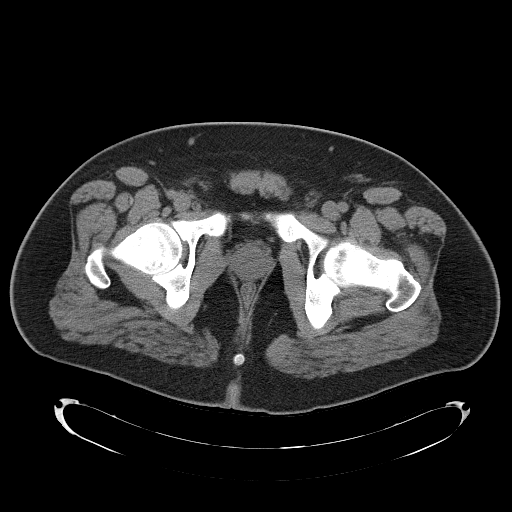
[im 20/102  soft-tissue]
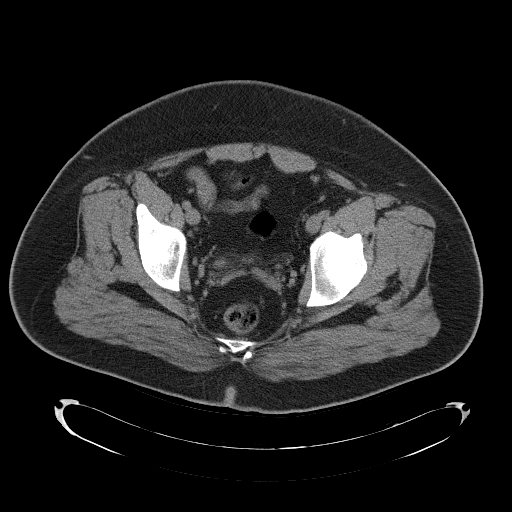
[im 28/102  soft-tissue]
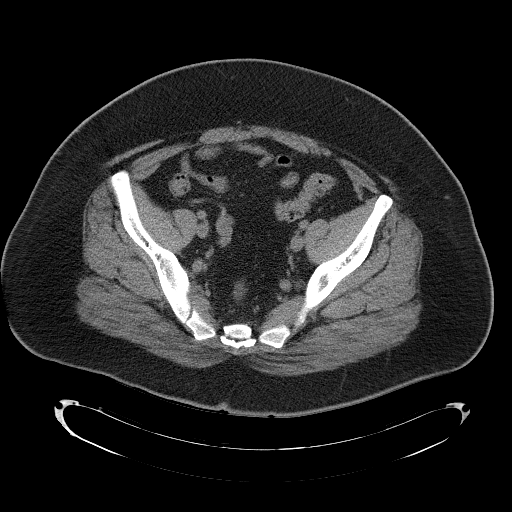
[im 35/102  soft-tissue]
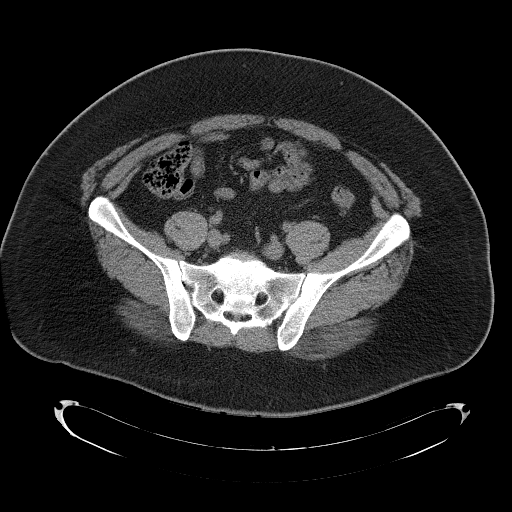
[im 39/102  soft-tissue]
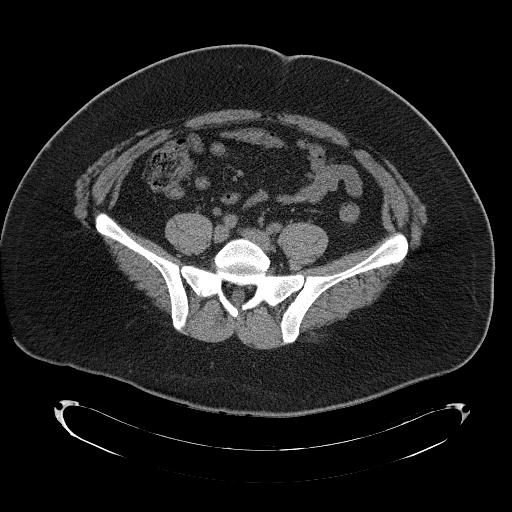
[im 47/102  soft-tissue]
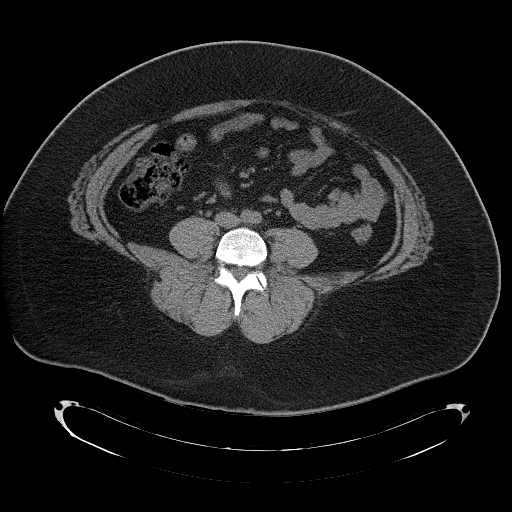
[im 55/102  soft-tissue]
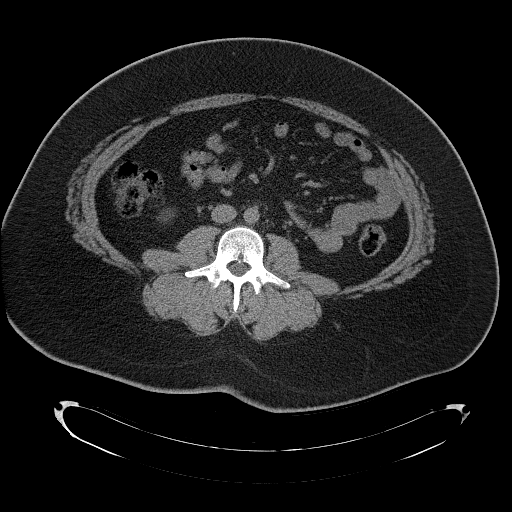
[im 63/102  soft-tissue]
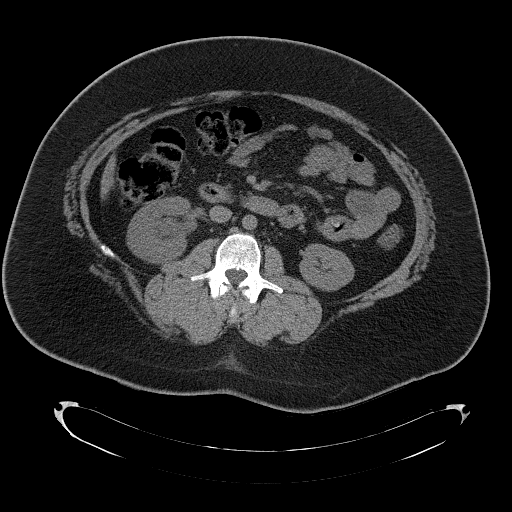
[im 63/102  bone]
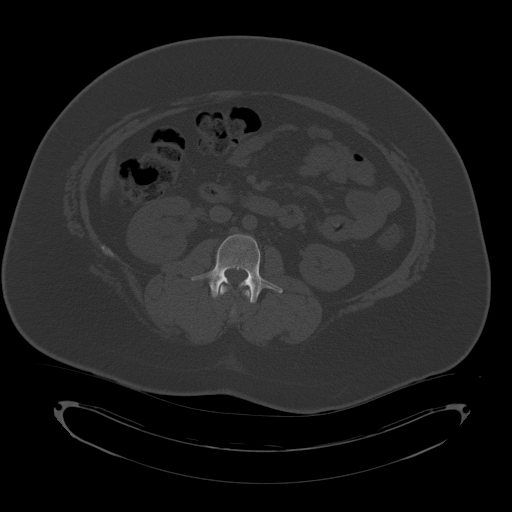
[im 67/102  soft-tissue]
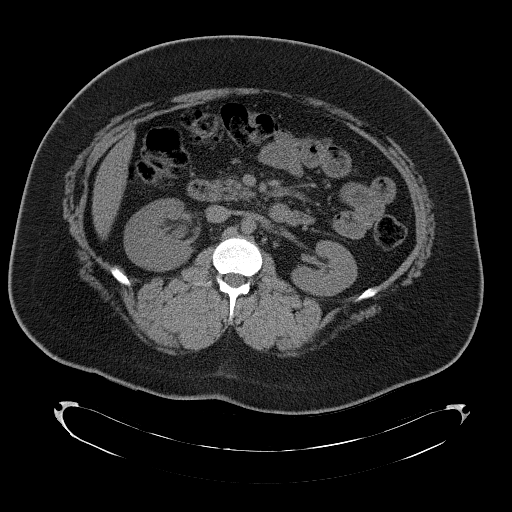
[im 74/102  soft-tissue]
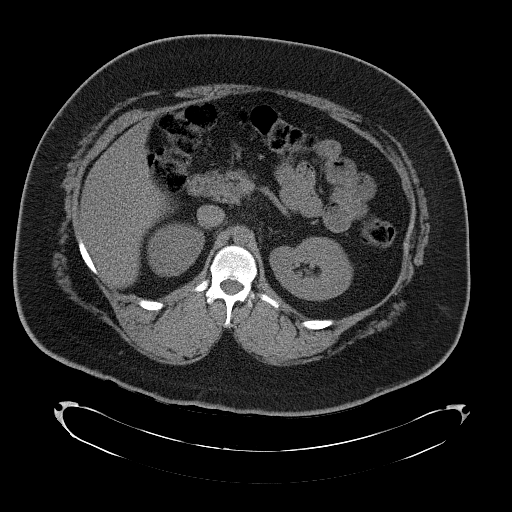
[im 82/102  soft-tissue]
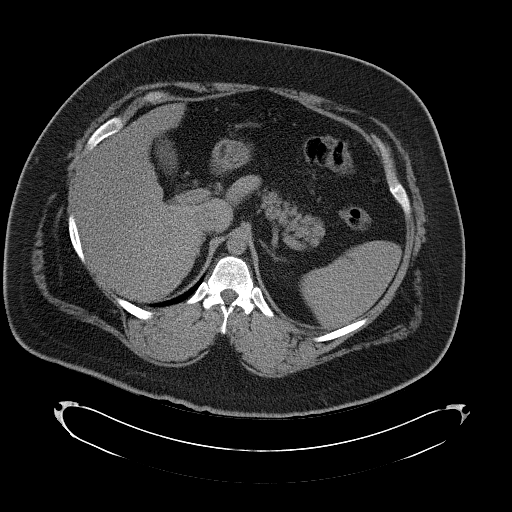
[im 90/102  soft-tissue]
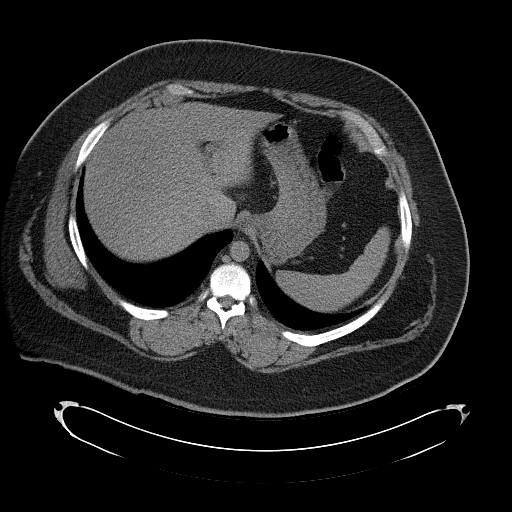
[im 98/102  soft-tissue]
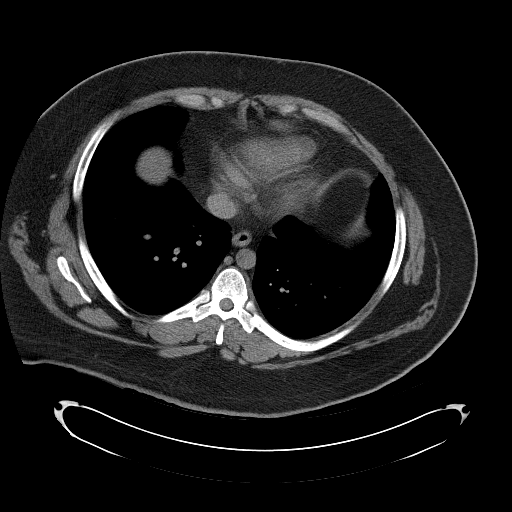

[Series 4: mpr coronal · coronal · 0.93mm/px · 3 of 128 slices shown]
[im 43/128  soft-tissue]
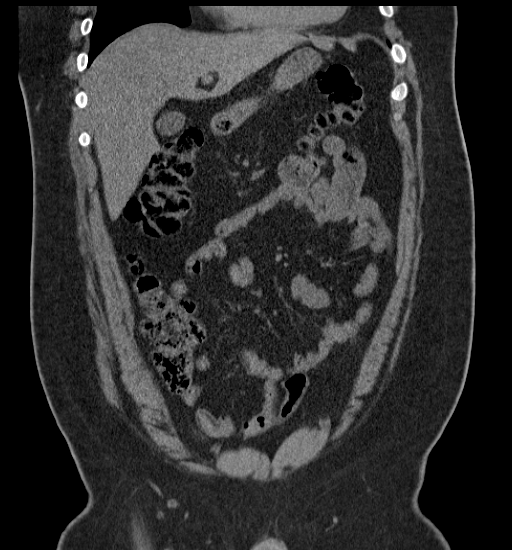
[im 57/128  soft-tissue]
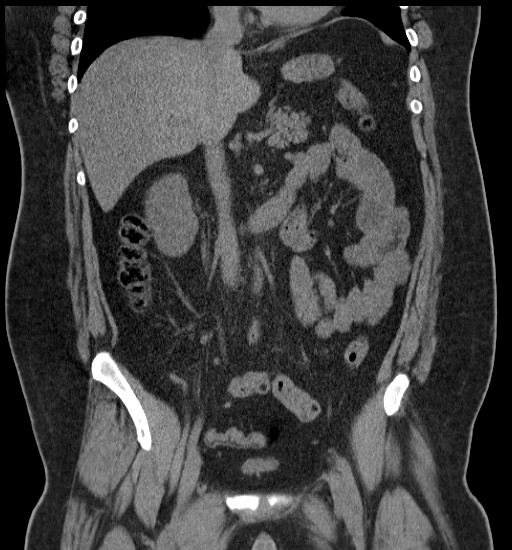
[im 71/128  soft-tissue]
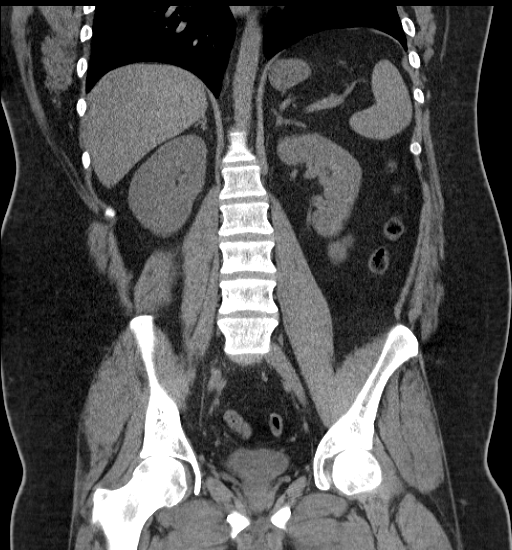

[17 of 46 positions shown; findings below may reference images not displayed]

FINDINGS: Lung bases clear.
Right hydronephrosis and hydroureter secondary to a 2 mm diameter
distal right ureteral calculus just above ureterovesicle junction.
Bladder decompressed.
Tiny nonobstructing calculus upper pole left kidney.
Within limits of a nonenhanced exam, no focal abnormalities of the
liver, spleen, pancreas, or adrenal glands.
Stomach decompressed, suboptimally assessed.
Normal appendix.
Minimal sigmoid diverticulosis.
Large and small bowel loops otherwise unremarkable for technique.
No mass, adenopathy, free fluid or inflammatory process.
IMPRESSION: Mild right hydronephrosis and hydroureter secondary to a 2 mm
diameter distal right ureteral calculus.
Nonobstructing calculus upper pole left kidney.
Minimal sigmoid diverticulosis.

## 2017-01-26 ENCOUNTER — Other Ambulatory Visit: Payer: Self-pay

## 2017-01-26 ENCOUNTER — Emergency Department (HOSPITAL_COMMUNITY)
Admission: EM | Admit: 2017-01-26 | Discharge: 2017-01-26 | Disposition: A | Discharge status: Home / Self Care | Payer: Medicaid Other | Attending: Emergency Medicine | Admitting: Emergency Medicine

## 2017-01-26 ENCOUNTER — Encounter (HOSPITAL_COMMUNITY): Payer: Self-pay | Admitting: Emergency Medicine

## 2017-01-26 DIAGNOSIS — Z79899 Other long term (current) drug therapy: Secondary | ICD-10-CM | POA: Diagnosis not present

## 2017-01-26 DIAGNOSIS — F840 Autistic disorder: Secondary | ICD-10-CM | POA: Insufficient documentation

## 2017-01-26 DIAGNOSIS — H9202 Otalgia, left ear: Secondary | ICD-10-CM | POA: Diagnosis present

## 2017-01-26 DIAGNOSIS — H6122 Impacted cerumen, left ear: Secondary | ICD-10-CM | POA: Diagnosis not present

## 2017-01-26 MED ORDER — CIPROFLOXACIN-DEXAMETHASONE 0.3-0.1 % OT SUSP: 4.0000 [drp] | Freq: Two times a day (BID) | OTIC | 0 refills | Status: AC

## 2017-01-26 MED ORDER — HYDROGEN PEROXIDE 3 % EX SOLN
CUTANEOUS | Status: AC
  Filled 2017-01-26: qty 473

## 2017-01-26 MED ORDER — HYDROGEN PEROXIDE 3 % EX SOLN
Freq: Once | CUTANEOUS | Status: AC
  Administered 2017-01-26: 17:00:00 via TOPICAL

## 2017-01-26 MED ORDER — AMOXICILLIN 500 MG PO CAPS: 500.0000 mg | ORAL_CAPSULE | Freq: Two times a day (BID) | ORAL | 0 refills | Status: AC

## 2017-01-26 NOTE — ED Triage Notes (Signed)
PT c/o left ear pain that started today with some decreased hearing today.

## 2017-01-26 NOTE — ED Provider Notes (Signed)
[facility] EMERGENCY DEPARTMENT Provider Note   CSN: [id number] Arrival date & time: 01/26/17  1413     History   Chief Complaint Chief Complaint  Patient presents with  . Otalgia    HPI Douglas Randolph is a 31 y.o. male with history of autism who presents with left ear fullness.  Patient reports he has had fullness on a stopped up feeling in his left ear since yesterday.  He reports having to have his ears washed out about 10 years ago the same thing happened.  He denies ear pain or fevers.  He used an over-the-counter eardrop and Q-tips without relief.  He reports he has had some nasal congestion over the past 24 hours, but no other recent illnesses.  HPI  Past Medical History:  Diagnosis Date  . Autism     There are no active problems to display for this patient.   Past Surgical History:  Procedure Laterality Date  . DEBRIDEMENT MANDIBLE Right 11/03/2012   Procedure: EXCISION TUMOR RIGHT MANDIBLE, BONE GRAFT RIGHT MANDIBLE;  Surgeon: Gae Bon, DDS;  Location: [place];  Service: Oral Surgery;  Laterality: Right;  . NO PAST SURGERIES    . TOOTH EXTRACTION N/A 11/03/2012   Procedure: EXTRACTIONS 1, 2, 5, 15, 16, 17, 21, 32;  Surgeon: Gae Bon, DDS;  Location: [place];  Service: Oral Surgery;  Laterality: N/A;       Home Medications    Prior to Admission medications   Medication Sig Start Date End Date Taking? Authorizing Provider  ALPRAZolam Duanne Moron) 0.5 MG tablet Take 0.5 mg by mouth at bedtime.     [provider]  amoxicillin (AMOXIL) 500 MG capsule Take 1 capsule (500 mg total) by mouth 2 (two) times daily. 01/26/17   Lucy Woolever, Bea Graff, PA-C  ARIPiprazole (ABILIFY) 5 MG tablet Take 5 mg by mouth at bedtime.     [provider]  ciprofloxacin-dexamethasone (CIPRODEX) OTIC suspension Place 4 drops into the left ear 2 (two) times daily for 5 days. 01/26/17 01/31/17  Frederica Kuster, PA-C  divalproex (DEPAKOTE ER) 500 MG 24 hr tablet Take 500  mg by mouth at bedtime.    [provider]  oxyCODONE-acetaminophen (PERCOCET) 5-325 MG per tablet Take 1-2 tablets by mouth every 4 (four) hours as needed for pain. 11/03/12   Diona Browner, DDS    Family History History reviewed. No pertinent family history.  Social History Social History   Tobacco Use  . Smoking status: Never Smoker  . Smokeless tobacco: Never Used  Substance Use Topics  . Alcohol use: No  . Drug use: No     Allergies   Patient has no known allergies.   Review of Systems Review of Systems  Constitutional: Negative for fever.  HENT: Positive for congestion, ear pain ("fullness") and hearing loss (some decreased hearing in the L ear).      Physical Exam Updated Vital Signs BP (!) 149/91   Pulse 79   Temp 97.6 F (36.4 C) (Oral)   Resp 18   SpO2 97%   Physical Exam  Constitutional: He appears well-developed and well-nourished. No distress.  HENT:  Head: Normocephalic and atraumatic.  Mouth/Throat: Oropharynx is clear and moist. No oropharyngeal exudate.  Bilateral cerumen impactions  Eyes: Conjunctivae are normal. Pupils are equal, round, and reactive to light. Right eye exhibits no discharge. Left eye exhibits no discharge. No scleral icterus.  Neck: Normal range of motion. Neck supple. No thyromegaly present.  Cardiovascular: Normal rate, regular rhythm, normal heart sounds and intact distal pulses. Exam reveals no gallop and no friction rub.  No murmur heard. Pulmonary/Chest: Effort normal and breath sounds normal. No stridor. No respiratory distress. He has no wheezes. He has no rales.  Musculoskeletal: He exhibits no edema.  Lymphadenopathy:    He has no cervical adenopathy.  Neurological: He is alert. Coordination normal.  Skin: Skin is warm and dry. No rash noted. He is not diaphoretic. No pallor.  Psychiatric: He has a normal mood and affect.  Nursing note and vitals reviewed.    ED Treatments / Results  Labs (all labs  ordered are listed, but only abnormal results are displayed) Labs Reviewed - No data to display  EKG  EKG Interpretation None       Radiology No results found.  Procedures .Ear Cerumen Removal Date/Time: 01/26/2017 4:49 PM Performed by: Frederica Kuster, PA-C Authorized by: Frederica Kuster, PA-C   Procedure details:    Location:  L ear   Procedure type: irrigation     Procedure type comment:  And curette Post-procedure details:    Inspection:  Macerated skin Comments:     Procedure not complete, unable to visualize TM. Patient's ear beginning to hurt and feeling mildy dizzy. Procedure discontinued as risk greater than benefit at this time.   (including critical care time)  Medications Ordered in ED Medications  hydrogen peroxide 3 % external solution (not administered)     Initial Impression / Assessment and Plan / ED Course  I have reviewed the triage vital signs and the nursing notes.  Pertinent labs & imaging results that were available during my care of the patient were reviewed by me and considered in my medical decision making (see chart for details).     Patient with bilateral cerumen impaction.  Large amounts of cerumen removed from left ear, however not completely removed and unable to visualize TM.  Patient does report that he feels less fullness and is able to hear better after procedure.  However considering some development of pain dizziness, will not try to remove any more cerumen at this time.  Will initiate Ciprodex prophylaxis.  Patient also advised to begin amoxicillin ear pain persists after 2-3 days, Ciprodex or if he develops fever.  Return precautions discussed.  Patient understands and agrees with plan.  Patient vitals stable throughout ED course and discharged in satisfactory condition.  Final Clinical Impressions(s) / ED Diagnoses   Final diagnoses:  Impacted cerumen of left ear    ED Discharge Orders        Ordered     ciprofloxacin-dexamethasone (CIPRODEX) OTIC suspension  2 times daily     01/26/17 1635    amoxicillin (AMOXIL) 500 MG capsule  2 times daily     01/26/17 [address] PA-C 01/26/17 [place], [place], DO 01/28/17 [phone]

## 2017-01-26 NOTE — Discharge Instructions (Signed)
Medications: Ciprodex, Amoxicillin  Treatment: Apply Ciprodex to L ear 2 times daily. Take amoxicillin twice daily for 7 days if your ear continues to hurt following today's wax removal.  Follow-up: Please follow up with your doctor for further management of your ear wax. Please return to the emergency department if you develop any new or worsening symptoms including fever, increasing ear pain, ear drainage or any other new or concerning symptoms.

## 2021-11-08 ENCOUNTER — Other Ambulatory Visit: Payer: Self-pay | Admitting: *Deleted

## 2021-11-08 ENCOUNTER — Other Ambulatory Visit (HOSPITAL_COMMUNITY): Payer: Self-pay | Admitting: *Deleted

## 2021-11-08 DIAGNOSIS — R748 Abnormal levels of other serum enzymes: Secondary | ICD-10-CM

## 2021-11-16 ENCOUNTER — Ambulatory Visit (HOSPITAL_COMMUNITY)
Admission: RE | Admit: 2021-11-16 | Discharge: 2021-11-16 | Disposition: A | Discharge status: Home / Self Care | Payer: Medicaid Other | Source: Ambulatory Visit | Attending: *Deleted | Admitting: *Deleted

## 2021-11-16 DIAGNOSIS — R748 Abnormal levels of other serum enzymes: Secondary | ICD-10-CM | POA: Diagnosis present
